# Patient Record
Sex: Female | Born: 1940 | Race: White | Hispanic: No | Marital: Married | Smoking: Never smoker
Health system: Southern US, Community
[De-identification: ages and names within clinical notes are randomized; demographics above are authoritative.]

## PROBLEM LIST (undated history)

## (undated) DIAGNOSIS — Z8719 Personal history of other diseases of the digestive system: Secondary | ICD-10-CM

## (undated) DIAGNOSIS — E039 Hypothyroidism, unspecified: Secondary | ICD-10-CM

## (undated) DIAGNOSIS — G709 Myoneural disorder, unspecified: Secondary | ICD-10-CM

## (undated) DIAGNOSIS — M1990 Unspecified osteoarthritis, unspecified site: Secondary | ICD-10-CM

## (undated) DIAGNOSIS — R42 Dizziness and giddiness: Secondary | ICD-10-CM

## (undated) DIAGNOSIS — I10 Essential (primary) hypertension: Secondary | ICD-10-CM

## (undated) DIAGNOSIS — E785 Hyperlipidemia, unspecified: Secondary | ICD-10-CM

## (undated) DIAGNOSIS — C801 Malignant (primary) neoplasm, unspecified: Secondary | ICD-10-CM

## (undated) DIAGNOSIS — F410 Panic disorder [episodic paroxysmal anxiety] without agoraphobia: Secondary | ICD-10-CM

## (undated) HISTORY — PX: JOINT REPLACEMENT: SHX530

## (undated) HISTORY — PX: COLONOSCOPY: SHX174

## (undated) HISTORY — PX: OTHER SURGICAL HISTORY: SHX169

## (undated) HISTORY — PX: ABDOMINAL HYSTERECTOMY: SHX81

---

## 2004-09-15 ENCOUNTER — Ambulatory Visit: Payer: Self-pay | Admitting: Internal Medicine

## 2004-10-06 ENCOUNTER — Ambulatory Visit: Payer: Self-pay | Admitting: Gastroenterology

## 2005-09-16 ENCOUNTER — Ambulatory Visit: Payer: Self-pay | Admitting: Internal Medicine

## 2006-09-19 ENCOUNTER — Ambulatory Visit: Payer: Self-pay | Admitting: Internal Medicine

## 2007-05-30 ENCOUNTER — Ambulatory Visit: Payer: Self-pay | Admitting: General Practice

## 2007-06-09 ENCOUNTER — Ambulatory Visit: Payer: Self-pay | Admitting: Internal Medicine

## 2007-06-14 ENCOUNTER — Inpatient Hospital Stay: Payer: Self-pay | Admitting: General Practice

## 2007-06-19 ENCOUNTER — Ambulatory Visit: Payer: Self-pay | Admitting: Internal Medicine

## 2007-07-10 ENCOUNTER — Ambulatory Visit: Payer: Self-pay | Admitting: Internal Medicine

## 2007-11-02 ENCOUNTER — Ambulatory Visit: Payer: Self-pay | Admitting: Internal Medicine

## 2007-11-08 ENCOUNTER — Ambulatory Visit: Payer: Self-pay | Admitting: Unknown Physician Specialty

## 2007-11-10 ENCOUNTER — Ambulatory Visit: Payer: Self-pay | Admitting: Internal Medicine

## 2008-05-28 ENCOUNTER — Ambulatory Visit: Payer: Self-pay | Admitting: Internal Medicine

## 2008-11-01 ENCOUNTER — Ambulatory Visit: Payer: Self-pay | Admitting: Otolaryngology

## 2009-02-08 HISTORY — PX: BREAST BIOPSY: SHX20

## 2009-08-15 ENCOUNTER — Ambulatory Visit: Payer: Self-pay | Admitting: Internal Medicine

## 2009-08-26 ENCOUNTER — Ambulatory Visit: Payer: Self-pay | Admitting: Internal Medicine

## 2009-10-01 ENCOUNTER — Ambulatory Visit: Payer: Self-pay | Admitting: Surgery

## 2010-01-30 ENCOUNTER — Ambulatory Visit: Payer: Self-pay | Admitting: Internal Medicine

## 2010-03-02 ENCOUNTER — Ambulatory Visit: Payer: Self-pay | Admitting: Surgery

## 2010-05-25 ENCOUNTER — Ambulatory Visit: Payer: Self-pay | Admitting: Gastroenterology

## 2010-08-17 ENCOUNTER — Ambulatory Visit: Payer: Self-pay | Admitting: Internal Medicine

## 2010-11-13 ENCOUNTER — Ambulatory Visit: Payer: Self-pay | Admitting: Internal Medicine

## 2011-08-18 ENCOUNTER — Ambulatory Visit: Payer: Self-pay | Admitting: Internal Medicine

## 2012-08-18 ENCOUNTER — Ambulatory Visit: Payer: Self-pay | Admitting: Internal Medicine

## 2013-10-01 ENCOUNTER — Ambulatory Visit: Payer: Self-pay | Admitting: Internal Medicine

## 2013-10-17 ENCOUNTER — Ambulatory Visit: Payer: Self-pay

## 2014-05-24 ENCOUNTER — Other Ambulatory Visit: Payer: Self-pay | Admitting: Internal Medicine

## 2014-05-24 DIAGNOSIS — Z1231 Encounter for screening mammogram for malignant neoplasm of breast: Secondary | ICD-10-CM

## 2014-10-03 ENCOUNTER — Other Ambulatory Visit: Payer: Self-pay | Admitting: Internal Medicine

## 2014-10-03 ENCOUNTER — Ambulatory Visit
Admission: RE | Admit: 2014-10-03 | Discharge: 2014-10-03 | Disposition: A | Discharge status: Home / Self Care | Payer: Medicare Other | Source: Ambulatory Visit | Attending: Internal Medicine | Admitting: Internal Medicine

## 2014-10-03 DIAGNOSIS — Z1231 Encounter for screening mammogram for malignant neoplasm of breast: Secondary | ICD-10-CM | POA: Diagnosis not present

## 2015-08-26 ENCOUNTER — Other Ambulatory Visit: Payer: Self-pay | Admitting: Internal Medicine

## 2015-08-26 DIAGNOSIS — Z1231 Encounter for screening mammogram for malignant neoplasm of breast: Secondary | ICD-10-CM

## 2015-10-06 ENCOUNTER — Ambulatory Visit: Payer: Medicare Other

## 2015-10-07 ENCOUNTER — Other Ambulatory Visit: Payer: Self-pay | Admitting: Internal Medicine

## 2015-10-09 ENCOUNTER — Ambulatory Visit: Payer: Medicare Other

## 2015-11-06 ENCOUNTER — Other Ambulatory Visit: Payer: Self-pay | Admitting: Internal Medicine

## 2015-11-06 ENCOUNTER — Ambulatory Visit
Admission: RE | Admit: 2015-11-06 | Discharge: 2015-11-06 | Disposition: A | Discharge status: Home / Self Care | Payer: Medicare Other | Source: Ambulatory Visit | Attending: Internal Medicine | Admitting: Internal Medicine

## 2015-11-06 DIAGNOSIS — R928 Other abnormal and inconclusive findings on diagnostic imaging of breast: Secondary | ICD-10-CM | POA: Diagnosis not present

## 2015-11-06 DIAGNOSIS — Z1231 Encounter for screening mammogram for malignant neoplasm of breast: Secondary | ICD-10-CM | POA: Diagnosis present

## 2015-11-10 ENCOUNTER — Other Ambulatory Visit: Payer: Self-pay | Admitting: Internal Medicine

## 2015-11-10 DIAGNOSIS — N6320 Unspecified lump in the left breast, unspecified quadrant: Secondary | ICD-10-CM

## 2015-11-18 ENCOUNTER — Ambulatory Visit
Admission: RE | Admit: 2015-11-18 | Discharge: 2015-11-18 | Disposition: A | Discharge status: Home / Self Care | Payer: Medicare Other | Source: Ambulatory Visit | Attending: Internal Medicine | Admitting: Internal Medicine

## 2015-11-18 DIAGNOSIS — N6320 Unspecified lump in the left breast, unspecified quadrant: Secondary | ICD-10-CM

## 2015-11-18 DIAGNOSIS — N6324 Unspecified lump in the left breast, lower inner quadrant: Secondary | ICD-10-CM | POA: Diagnosis not present

## 2016-11-29 ENCOUNTER — Other Ambulatory Visit: Payer: Self-pay | Admitting: Internal Medicine

## 2016-11-29 DIAGNOSIS — Z1231 Encounter for screening mammogram for malignant neoplasm of breast: Secondary | ICD-10-CM

## 2016-12-16 ENCOUNTER — Ambulatory Visit
Admission: RE | Admit: 2016-12-16 | Discharge: 2016-12-16 | Disposition: A | Discharge status: Home / Self Care | Payer: Medicare Other | Source: Ambulatory Visit | Attending: Internal Medicine | Admitting: Internal Medicine

## 2016-12-16 DIAGNOSIS — Z1231 Encounter for screening mammogram for malignant neoplasm of breast: Secondary | ICD-10-CM | POA: Diagnosis not present

## 2016-12-20 ENCOUNTER — Other Ambulatory Visit: Payer: Self-pay | Admitting: Internal Medicine

## 2016-12-20 DIAGNOSIS — R928 Other abnormal and inconclusive findings on diagnostic imaging of breast: Secondary | ICD-10-CM

## 2016-12-20 DIAGNOSIS — N6489 Other specified disorders of breast: Secondary | ICD-10-CM

## 2017-01-05 ENCOUNTER — Ambulatory Visit
Admission: RE | Admit: 2017-01-05 | Discharge: 2017-01-05 | Disposition: A | Discharge status: Home / Self Care | Payer: Medicare Other | Source: Ambulatory Visit | Attending: Internal Medicine | Admitting: Internal Medicine

## 2017-01-05 DIAGNOSIS — N6489 Other specified disorders of breast: Secondary | ICD-10-CM | POA: Diagnosis not present

## 2017-01-05 DIAGNOSIS — N6001 Solitary cyst of right breast: Secondary | ICD-10-CM | POA: Insufficient documentation

## 2017-01-05 DIAGNOSIS — R928 Other abnormal and inconclusive findings on diagnostic imaging of breast: Secondary | ICD-10-CM

## 2017-03-01 ENCOUNTER — Encounter: Payer: Self-pay | Admitting: *Deleted

## 2017-03-02 ENCOUNTER — Ambulatory Visit: Payer: Medicare Other | Admitting: Anesthesiology

## 2017-03-02 ENCOUNTER — Ambulatory Visit
Admission: RE | Admit: 2017-03-02 | Discharge: 2017-03-02 | Disposition: A | Discharge status: Home / Self Care | Payer: Medicare Other | Source: Ambulatory Visit | Attending: Internal Medicine | Admitting: Internal Medicine

## 2017-03-02 ENCOUNTER — Encounter
Admission: RE | Disposition: A | Discharge status: Home / Self Care | Payer: Self-pay | Source: Ambulatory Visit | Attending: Internal Medicine

## 2017-03-02 ENCOUNTER — Encounter: Payer: Self-pay | Admitting: *Deleted

## 2017-03-02 DIAGNOSIS — Z79890 Hormone replacement therapy (postmenopausal): Secondary | ICD-10-CM | POA: Diagnosis not present

## 2017-03-02 DIAGNOSIS — F410 Panic disorder [episodic paroxysmal anxiety] without agoraphobia: Secondary | ICD-10-CM | POA: Insufficient documentation

## 2017-03-02 DIAGNOSIS — E785 Hyperlipidemia, unspecified: Secondary | ICD-10-CM | POA: Diagnosis not present

## 2017-03-02 DIAGNOSIS — I10 Essential (primary) hypertension: Secondary | ICD-10-CM | POA: Diagnosis not present

## 2017-03-02 DIAGNOSIS — E039 Hypothyroidism, unspecified: Secondary | ICD-10-CM | POA: Diagnosis not present

## 2017-03-02 DIAGNOSIS — K635 Polyp of colon: Secondary | ICD-10-CM | POA: Diagnosis not present

## 2017-03-02 DIAGNOSIS — K621 Rectal polyp: Secondary | ICD-10-CM | POA: Insufficient documentation

## 2017-03-02 DIAGNOSIS — M1990 Unspecified osteoarthritis, unspecified site: Secondary | ICD-10-CM | POA: Diagnosis not present

## 2017-03-02 DIAGNOSIS — Z1211 Encounter for screening for malignant neoplasm of colon: Secondary | ICD-10-CM | POA: Insufficient documentation

## 2017-03-02 DIAGNOSIS — G2581 Restless legs syndrome: Secondary | ICD-10-CM | POA: Insufficient documentation

## 2017-03-02 DIAGNOSIS — Z79899 Other long term (current) drug therapy: Secondary | ICD-10-CM | POA: Diagnosis not present

## 2017-03-02 DIAGNOSIS — Z85828 Personal history of other malignant neoplasm of skin: Secondary | ICD-10-CM | POA: Diagnosis not present

## 2017-03-02 DIAGNOSIS — Z86010 Personal history of colonic polyps: Secondary | ICD-10-CM | POA: Diagnosis not present

## 2017-03-02 HISTORY — DX: Hypothyroidism, unspecified: E03.9

## 2017-03-02 HISTORY — DX: Panic disorder (episodic paroxysmal anxiety): F41.0

## 2017-03-02 HISTORY — DX: Myoneural disorder, unspecified: G70.9

## 2017-03-02 HISTORY — DX: Essential (primary) hypertension: I10

## 2017-03-02 HISTORY — PX: COLONOSCOPY WITH PROPOFOL: SHX5780

## 2017-03-02 HISTORY — DX: Personal history of other diseases of the digestive system: Z87.19

## 2017-03-02 HISTORY — DX: Unspecified osteoarthritis, unspecified site: M19.90

## 2017-03-02 HISTORY — DX: Malignant (primary) neoplasm, unspecified: C80.1

## 2017-03-02 HISTORY — DX: Hyperlipidemia, unspecified: E78.5

## 2017-03-02 SURGERY — COLONOSCOPY WITH PROPOFOL
Anesthesia: General

## 2017-03-02 MED ORDER — LIDOCAINE 2% (20 MG/ML) 5 ML SYRINGE
INTRAMUSCULAR | Status: DC | PRN
  Administered 2017-03-02: 40 mg via INTRAVENOUS

## 2017-03-02 MED ORDER — SODIUM CHLORIDE 0.9 % IV SOLN
INTRAVENOUS | Status: DC
  Administered 2017-03-02: 1000 mL via INTRAVENOUS
  Administered 2017-03-02: 11:00:00 via INTRAVENOUS

## 2017-03-02 MED ORDER — FENTANYL CITRATE (PF) 100 MCG/2ML IJ SOLN
INTRAMUSCULAR | Status: DC | PRN
  Administered 2017-03-02 (×2): 50 ug via INTRAVENOUS

## 2017-03-02 MED ORDER — FENTANYL CITRATE (PF) 100 MCG/2ML IJ SOLN
INTRAMUSCULAR | Status: AC
  Filled 2017-03-02: qty 2

## 2017-03-02 MED ORDER — PROPOFOL 10 MG/ML IV BOLUS
INTRAVENOUS | Status: DC | PRN
  Administered 2017-03-02: 100 mg via INTRAVENOUS

## 2017-03-02 MED ORDER — SODIUM CHLORIDE 0.9 % IV SOLN
INTRAVENOUS | Status: AC
  Administered 2017-03-02: 2 mg
  Filled 2017-03-02: qty 2000

## 2017-03-02 MED ORDER — PROPOFOL 500 MG/50ML IV EMUL
INTRAVENOUS | Status: AC
  Filled 2017-03-02: qty 50

## 2017-03-02 MED ORDER — EPHEDRINE SULFATE 50 MG/ML IJ SOLN
INTRAMUSCULAR | Status: DC | PRN
  Administered 2017-03-02: 5 mg via INTRAVENOUS

## 2017-03-02 MED ORDER — SODIUM CHLORIDE 0.9 % IV SOLN: 2.0000 g | Freq: Once | INTRAVENOUS | Status: DC

## 2017-03-02 MED ORDER — PHENYLEPHRINE HCL 10 MG/ML IJ SOLN
INTRAMUSCULAR | Status: DC | PRN
  Administered 2017-03-02: 100 ug via INTRAVENOUS

## 2017-03-02 MED ORDER — PROPOFOL 500 MG/50ML IV EMUL
INTRAVENOUS | Status: DC | PRN
  Administered 2017-03-02: 140 ug/kg/min via INTRAVENOUS

## 2017-03-02 NOTE — Anesthesia Post-op Follow-up Note (Signed)
Anesthesia QCDR form completed.        

## 2017-03-02 NOTE — Anesthesia Preprocedure Evaluation (Signed)
Anesthesia Evaluation  Patient identified by MRN, date of birth, ID band Patient awake    Reviewed: Allergy & Precautions, NPO status , Patient's Chart, lab work & pertinent test results  History of Anesthesia Complications Negative for: history of anesthetic complications  Airway Mallampati: III  TM Distance: >3 FB Neck ROM: Full    Dental no notable dental hx.    Pulmonary neg pulmonary ROS, neg sleep apnea, neg COPD,    breath sounds clear to auscultation- rhonchi (-) wheezing      Cardiovascular hypertension, Pt. on medications (-) CAD, (-) Past MI, (-) Cardiac Stents and (-) CABG  Rhythm:Regular Rate:Normal - Systolic murmurs and - Diastolic murmurs    Neuro/Psych PSYCHIATRIC DISORDERS Anxiety negative neurological ROS     GI/Hepatic Neg liver ROS, hiatal hernia,   Endo/Other  neg diabetesHypothyroidism   Renal/GU negative Renal ROS     Musculoskeletal  (+) Arthritis ,   Abdominal (+) + obese,   Peds  Hematology negative hematology ROS (+)   Anesthesia Other Findings Past Medical History: No date: Arthritis No date: Cancer ([place])     Comment:  skin cancer No date: History of hiatal hernia No date: Hyperlipidemia No date: Hypertension No date: Hypothyroidism No date: Neuromuscular disorder (HCC)     Comment:  restless leg No date: Panic disorder   Reproductive/Obstetrics                             Anesthesia Physical Anesthesia Plan  ASA: II  Anesthesia Plan: General   Post-op Pain Management:    Induction: Intravenous  PONV Risk Score and Plan: 2 and Propofol infusion  Airway Management Planned: Natural Airway  Additional Equipment:   Intra-op Plan:   Post-operative Plan:   Informed Consent: I have reviewed the patients History and Physical, chart, labs and discussed the procedure including the risks, benefits and alternatives for the proposed anesthesia with  the patient or authorized representative who has indicated his/her understanding and acceptance.   Dental advisory given  Plan Discussed with: CRNA and Anesthesiologist  Anesthesia Plan Comments:         Anesthesia Quick Evaluation

## 2017-03-02 NOTE — Interval H&P Note (Signed)
History and Physical Interval Note:  03/02/2017 10:39 AM  Michelle Middleton  has presented today for surgery, with the diagnosis of ph ta polyps  The various methods of treatment have been discussed with the patient and family. After consideration of risks, benefits and other options for treatment, the patient has consented to  Procedure(s): COLONOSCOPY WITH PROPOFOL (N/A) as a surgical intervention .  The patient's history has been reviewed, patient examined, no change in status, stable for surgery.  I have reviewed the patient's chart and labs.  Questions were answered to the patient's satisfaction.     [place], [place]

## 2017-03-02 NOTE — H&P (Signed)
Outpatient short stay form Pre-procedure 03/02/2017 10:38 AM Pericles Carmicheal K. Alice Reichert, M.D.  Primary Physician: Emily Filbert, M.D.  Reason for visit:   Personal hx of adenomatous colon polyps - 2006  History of present illness:  Patient presents for a personal hx of colon polyps and has colonoscopy scheduled today for colon polyps surveillance. Patient denies abdominal pain, change in bowel habits or rectal bleeding.     Current Facility-Administered Medications:  .  0.9 %  sodium chloride infusion, , Intravenous, Continuous, [place], Benay Pike, MD, Last Rate: 20 mL/hr at 03/02/17 1004 .  ampicillin (OMNIPEN) 2 g in sodium chloride 0.9 % 50 mL IVPB, 2 g, Intravenous, Once, [place], Benay Pike, MD  Medications Prior to Admission  Medication Sig Dispense Refill Last Dose  . ALPRAZolam (XANAX) 0.5 MG tablet Take 0.5 mg by mouth 2 (two) times daily as needed for anxiety.     Marland Kitchen amLODipine (NORVASC) 5 MG tablet Take 5 mg by mouth daily.   03/02/2017 at Unknown time  . cetirizine (ZYRTEC) 10 MG tablet Take 10 mg by mouth daily.     Marland Kitchen docusate sodium (COLACE) 100 MG capsule Take 100 mg by mouth daily.     . fluticasone (FLONASE) 50 MCG/ACT nasal spray Place 2 sprays into both nostrils daily.     Marland Kitchen gabapentin (NEURONTIN) 100 MG capsule Take 100 mg by mouth at bedtime.     . hydrochlorothiazide (HYDRODIURIL) 25 MG tablet Take 25 mg by mouth daily as needed.     Marland Kitchen levothyroxine (SYNTHROID, LEVOTHROID) 112 MCG tablet Take 112 mcg by mouth daily before breakfast.     . meloxicam (MOBIC) 7.5 MG tablet Take 7.5 mg by mouth daily.     . pantoprazole (PROTONIX) 40 MG tablet Take 40 mg by mouth daily.     Marland Kitchen spironolactone (ALDACTONE) 25 MG tablet Take 25 mg by mouth daily.     . [DISCONTINUED] telmisartan (MICARDIS) 80 MG tablet Take 80 mg by mouth daily.        Allergies  Allergen Reactions  . Allegra-D [Fexofenadine-Pseudoephed Er]   . Aspirin   . Coumadin [Warfarin]   . Heparin   . Lovenox [Enoxaparin]    . Paxil [Paroxetine Hcl]      Past Medical History:  Diagnosis Date  . Arthritis   . Cancer ([place])    skin cancer  . History of hiatal hernia   . Hyperlipidemia   . Hypertension   . Hypothyroidism   . Neuromuscular disorder (HCC)    restless leg  . Panic disorder     Review of systems:      Physical Exam  General appearance: alert, cooperative and appears stated age Resp: clear to auscultation bilaterally Cardio: regular rate and rhythm, S1, S2 normal, no murmur, click, rub or gallop GI: soft, non-tender; bowel sounds normal; no masses,  no organomegaly Extremities: extremities normal, atraumatic, no cyanosis or edema     Planned procedures: Colonoscopy. The patient understands the nature of the planned procedure, indications, risks, alternatives and potential complications including but not limited to bleeding, infection, perforation, damage to internal organs and possible oversedation/side effects from anesthesia. The patient agrees and gives consent to proceed.  Please refer to procedure notes for findings, recommendations and patient disposition/instructions.    Daiki Dicostanzo K. Alice Reichert, M.D. Gastroenterology 03/02/2017  10:38 AM

## 2017-03-02 NOTE — Op Note (Signed)
[facility] Patient Name: Michelle Middleton Procedure Date: 03/02/2017 10:41 AM MRN: [id number] Account #: [id number] Date of Birth: [birth year] Admit Type: Outpatient Age: 77 Room: [facility] ENDO ROOM 3 Gender: Female Note Status: Finalized Procedure:            Colonoscopy Indications:          High risk colon cancer surveillance: Personal history                        of colonic polyps Providers:            Benay Pike. Alice Reichert MD, MD Referring MD:         Rusty Aus, MD (Referring MD) Medicines:            Propofol per Anesthesia Complications:        No immediate complications. Procedure:            Pre-Anesthesia Assessment:                       - The risks and benefits of the procedure and the                        sedation options and risks were discussed with the                        patient. All questions were answered and informed                        consent was obtained.                       - Patient identification and proposed procedure were                        verified prior to the procedure by the nurse. The                        procedure was verified in the procedure room.                       - ASA Grade Assessment: II - A patient with mild                        systemic disease.                       - After reviewing the risks and benefits, the patient                        was deemed in satisfactory condition to undergo the                        procedure.                       After obtaining informed consent, the colonoscope was                        passed under direct vision. Throughout the procedure,  the patient's blood pressure, pulse, and oxygen                        saturations were monitored continuously. The                        Colonoscope was introduced through the anus and                        advanced to the the cecum, identified by appendiceal   orifice and ileocecal valve. The colonoscopy was                        somewhat difficult due to significant looping.                        Successful completion of the procedure was aided by                        applying abdominal pressure. The patient tolerated the                        procedure well. The quality of the bowel preparation                        was good. Findings:      The perianal and digital rectal examinations were normal. Pertinent       negatives include normal sphincter tone and no palpable rectal lesions.      Two sessile polyps were found in the rectum and sigmoid colon. The       polyps were 2 to 3 mm in size. These polyps were removed with a jumbo       cold forceps. Resection and retrieval were complete.      The exam was otherwise without abnormality.      Non-bleeding internal hemorrhoids were found during retroflexion. The       hemorrhoids were Grade I (internal hemorrhoids that do not prolapse). Impression:           - Two 2 to 3 mm polyps in the rectum and in the sigmoid                        colon, removed with a jumbo cold forceps. Resected and                        retrieved.                       - The examination was otherwise normal. Recommendation:       - Patient has a contact number available for                        emergencies. The signs and symptoms of potential                        delayed complications were discussed with the patient.                        Return to normal activities tomorrow. Written discharge  instructions were provided to the patient.                       - Resume previous diet.                       - Continue present medications.                       - Await pathology results.                       - Repeat colonoscopy is recommended for surveillance.                        The colonoscopy date will be determined after pathology                        results from today's exam  become available for review.                       - Return to GI office PRN.                       - The findings and recommendations were discussed with                        the patient and their family. Procedure Code(s):    --- Professional ---                       [phone], Colonoscopy, flexible; with biopsy, single or                        multiple Diagnosis Code(s):    --- Professional ---                       D12.5, Benign neoplasm of sigmoid colon                       K62.1, Rectal polyp                       Z86.010, Personal history of colonic polyps CPT copyright 2016 [employer]. All rights reserved. The codes documented in this report are preliminary and upon coder review may  be revised to meet current compliance requirements. Efrain Sella MD, MD 03/02/2017 11:14:23 AM This report has been signed electronically. Number of Addenda: 0 Note Initiated On: 03/02/2017 10:41 AM Scope Withdrawal Time: 0 hours 7 minutes 10 seconds  Total Procedure Duration: 0 hours 14 minutes 35 seconds       [facility]

## 2017-03-02 NOTE — Transfer of Care (Signed)
Immediate Anesthesia Transfer of Care Note  Patient: Michelle Middleton  Procedure(s) Performed: COLONOSCOPY WITH PROPOFOL (N/A )  Patient Location: PACU and Endoscopy Unit  Anesthesia Type:General  Level of Consciousness: awake, oriented and patient cooperative  Airway & Oxygen Therapy: Patient Spontanous Breathing and Patient connected to nasal cannula oxygen  Post-op Assessment: Report given to RN and Post -op Vital signs reviewed and stable  Post vital signs: Reviewed and stable  Last Vitals:  Vitals:   03/02/17 0942 03/02/17 1110  BP: (!) 158/76   Pulse: (!) 105   Resp: 18   Temp: (!) 35.8 C (!) 36.4 C  SpO2: 98%     Last Pain:  Vitals:   03/02/17 1110  TempSrc: Tympanic         Complications: No apparent anesthesia complications

## 2017-03-02 NOTE — Anesthesia Postprocedure Evaluation (Signed)
Anesthesia Post Note  Patient: Michelle Middleton  Procedure(s) Performed: COLONOSCOPY WITH PROPOFOL (N/A )  Patient location during evaluation: Endoscopy Anesthesia Type: General Level of consciousness: awake and alert and oriented Pain management: pain level controlled Vital Signs Assessment: post-procedure vital signs reviewed and stable Respiratory status: spontaneous breathing, nonlabored ventilation and respiratory function stable Cardiovascular status: blood pressure returned to baseline and stable Postop Assessment: no signs of nausea or vomiting Anesthetic complications: no     Last Vitals:  Vitals:   03/02/17 1120 03/02/17 1123  BP:    Pulse: 87 89  Resp: 18 18  Temp:    SpO2: 97% 96%    Last Pain:  Vitals:   03/02/17 1115  TempSrc: Tympanic                 Brandey Vandalen

## 2017-03-03 ENCOUNTER — Encounter: Payer: Self-pay | Admitting: Internal Medicine

## 2017-03-03 LAB — SURGICAL PATHOLOGY

## 2018-03-20 ENCOUNTER — Other Ambulatory Visit: Payer: Self-pay | Admitting: Internal Medicine

## 2018-03-20 DIAGNOSIS — Z1231 Encounter for screening mammogram for malignant neoplasm of breast: Secondary | ICD-10-CM

## 2018-04-03 ENCOUNTER — Ambulatory Visit
Admission: RE | Admit: 2018-04-03 | Discharge: 2018-04-03 | Disposition: A | Discharge status: Home / Self Care | Payer: Medicare Other | Source: Ambulatory Visit | Attending: Internal Medicine | Admitting: Internal Medicine

## 2018-04-03 ENCOUNTER — Encounter (INDEPENDENT_AMBULATORY_CARE_PROVIDER_SITE_OTHER): Payer: Self-pay

## 2018-04-03 DIAGNOSIS — Z1231 Encounter for screening mammogram for malignant neoplasm of breast: Secondary | ICD-10-CM | POA: Diagnosis present

## 2018-11-16 ENCOUNTER — Other Ambulatory Visit: Payer: Self-pay

## 2018-11-16 DIAGNOSIS — Z20822 Contact with and (suspected) exposure to covid-19: Secondary | ICD-10-CM

## 2018-11-17 LAB — NOVEL CORONAVIRUS, NAA: SARS-CoV-2, NAA: NOT DETECTED

## 2019-05-22 ENCOUNTER — Encounter: Payer: Self-pay | Admitting: Ophthalmology

## 2019-05-22 ENCOUNTER — Other Ambulatory Visit: Payer: Self-pay

## 2019-05-24 ENCOUNTER — Other Ambulatory Visit: Payer: Self-pay

## 2019-05-24 ENCOUNTER — Other Ambulatory Visit
Admission: RE | Admit: 2019-05-24 | Discharge: 2019-05-24 | Disposition: A | Discharge status: Home / Self Care | Payer: Medicare Other | Source: Ambulatory Visit | Attending: Ophthalmology | Admitting: Ophthalmology

## 2019-05-24 DIAGNOSIS — Z01812 Encounter for preprocedural laboratory examination: Secondary | ICD-10-CM | POA: Diagnosis present

## 2019-05-24 DIAGNOSIS — Z20822 Contact with and (suspected) exposure to covid-19: Secondary | ICD-10-CM | POA: Insufficient documentation

## 2019-05-24 LAB — SARS CORONAVIRUS 2 (TAT 6-24 HRS): SARS Coronavirus 2: NEGATIVE

## 2019-05-24 IMAGING — MG MM DIGITAL SCREENING BILAT W/ TOMO W/ CAD
8 of 16 series · 8 of 32 positions shown · non-contrast
Comparison: Previous exam(s).

CLINICAL DATA: Screening.

EXAM:
2D DIGITAL SCREENING BILATERAL MAMMOGRAM WITH CAD AND ADJUNCT TOMO

[R MLO (1 of 2)]
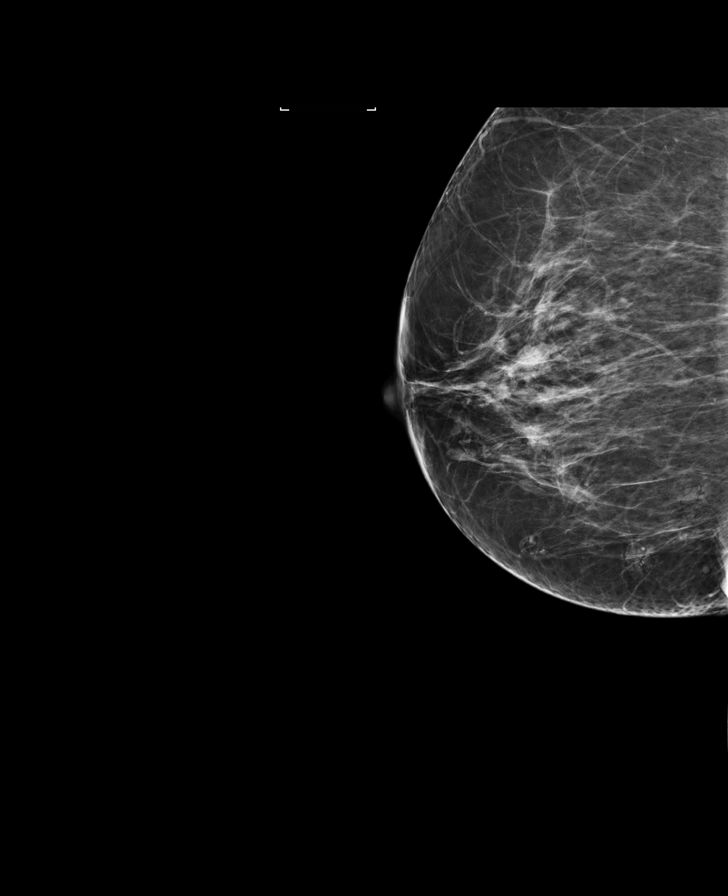

[R MLO (2 of 2)]
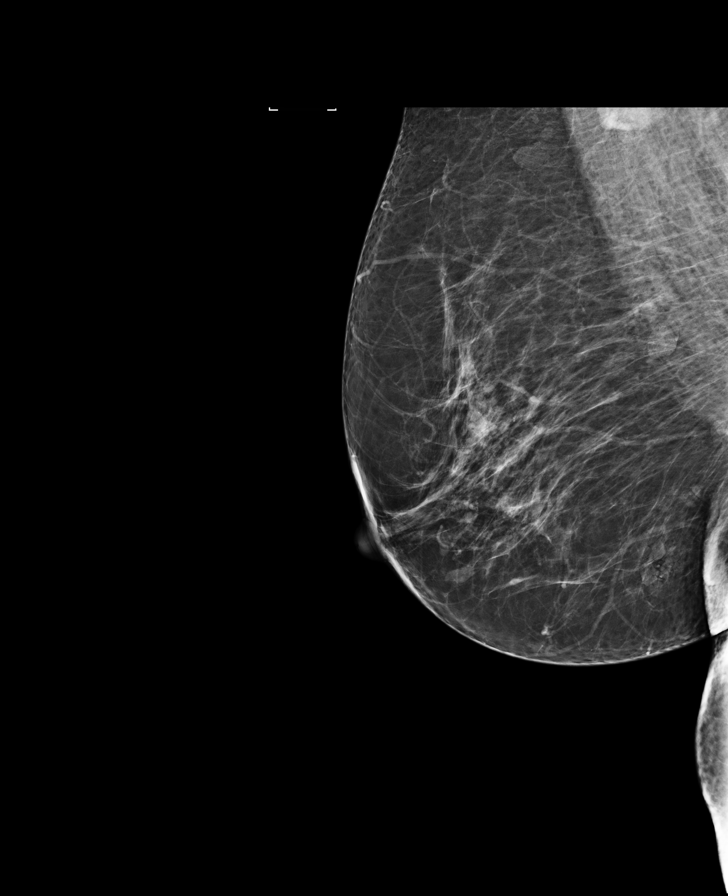

[R MLO synth-2D]
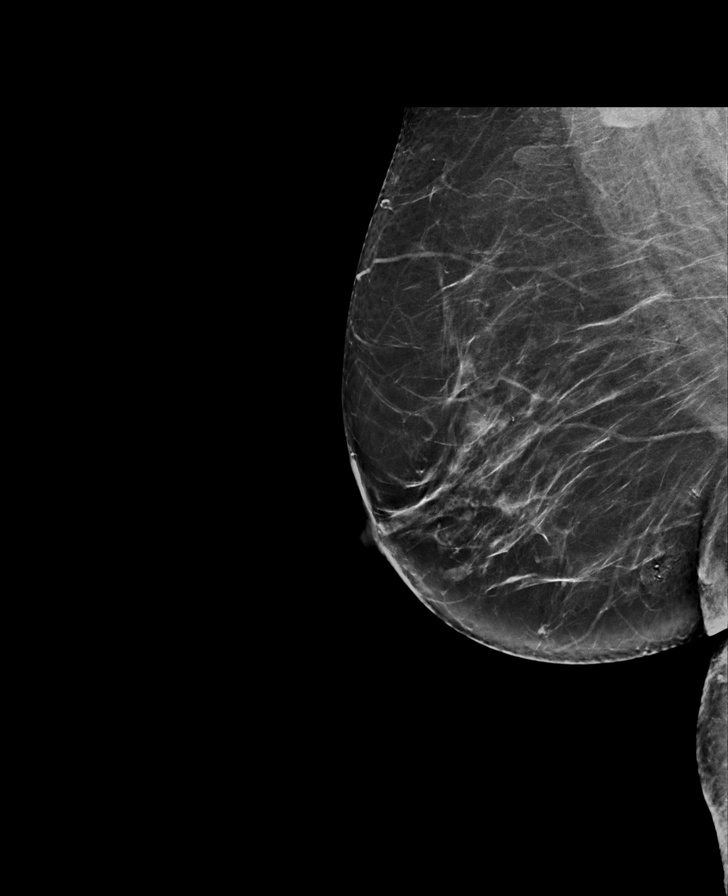

[L MLO synth-2D]
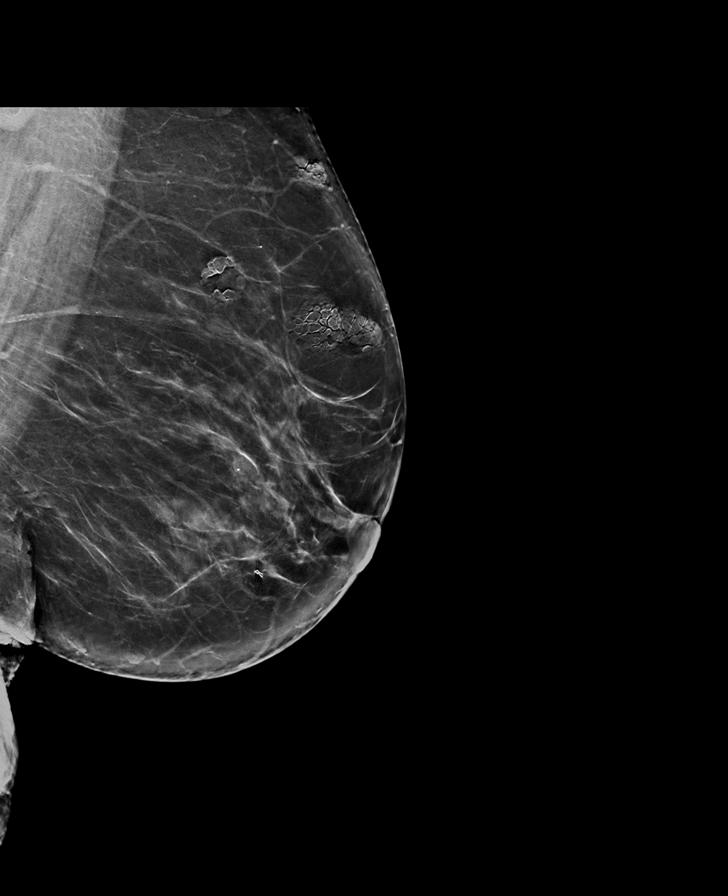

[L MLO]
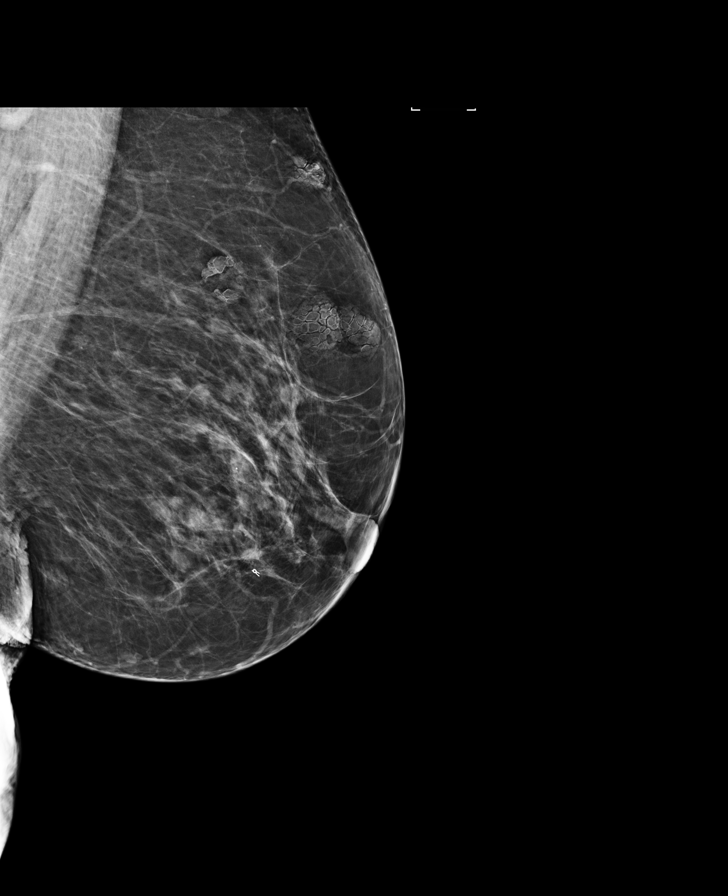

[R CC]
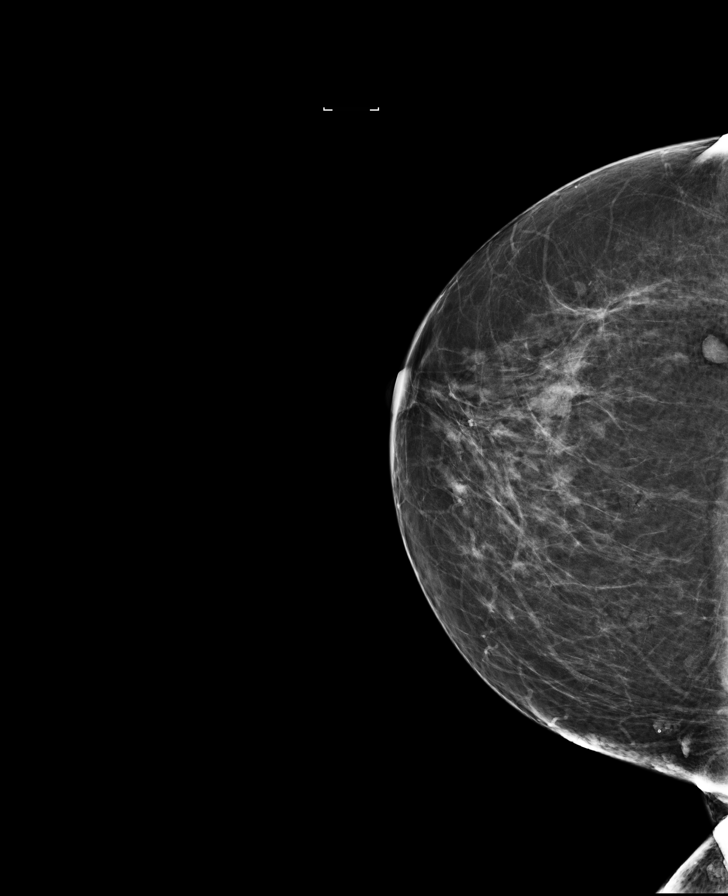

[L CC]
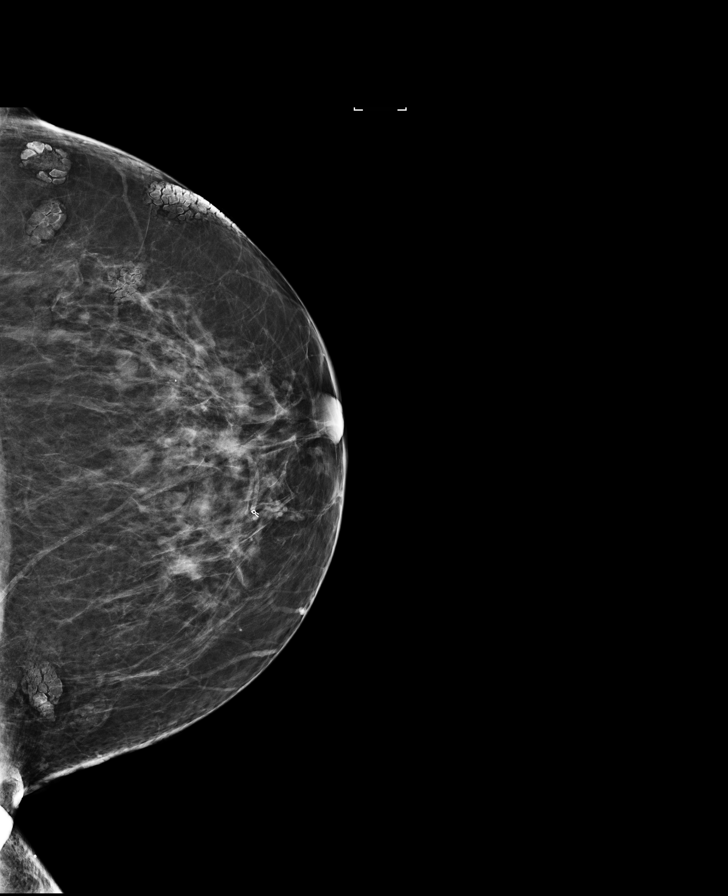

[R CC synth-2D]
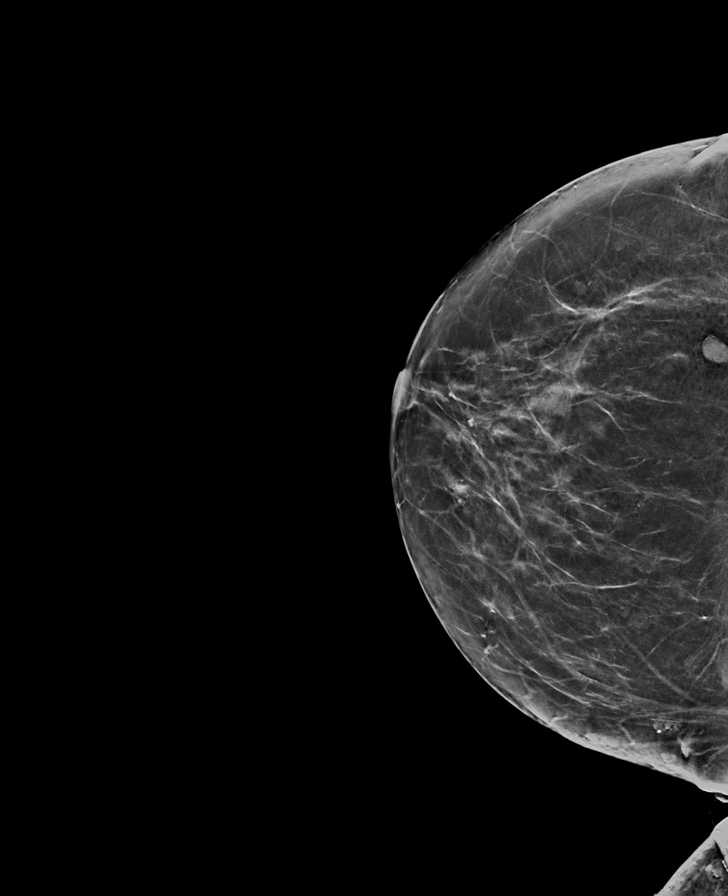

[8 of 32 positions shown; findings below may reference images not displayed]

ACR Breast Density Category b: There are scattered areas of
fibroglandular density.
FINDINGS: In the right breast, a possible asymmetry warrants further
evaluation. In the left breast, no findings suspicious for
malignancy. Images were processed with CAD.
IMPRESSION: Further evaluation is suggested for possible asymmetry in the right
breast.

RECOMMENDATION:
Diagnostic mammogram and possibly ultrasound of the right breast.
(Code:[id number]-[id number])

The patient will be contacted regarding the findings, and additional
imaging will be scheduled.

BI-RADS CATEGORY  0: Incomplete. Need additional imaging evaluation
and/or prior mammograms for comparison.

## 2019-05-24 NOTE — Discharge Instructions (Signed)

## 2019-05-28 ENCOUNTER — Ambulatory Visit
Admission: RE | Admit: 2019-05-28 | Discharge: 2019-05-28 | Disposition: A | Discharge status: Home / Self Care | Payer: Medicare Other | Attending: Ophthalmology | Admitting: Ophthalmology

## 2019-05-28 ENCOUNTER — Encounter
Admission: RE | Disposition: A | Discharge status: Home / Self Care | Payer: Self-pay | Source: Home / Self Care | Attending: Ophthalmology

## 2019-05-28 ENCOUNTER — Ambulatory Visit: Payer: Medicare Other | Admitting: Anesthesiology

## 2019-05-28 ENCOUNTER — Encounter: Payer: Self-pay | Admitting: Ophthalmology

## 2019-05-28 DIAGNOSIS — H2512 Age-related nuclear cataract, left eye: Secondary | ICD-10-CM | POA: Insufficient documentation

## 2019-05-28 DIAGNOSIS — M1990 Unspecified osteoarthritis, unspecified site: Secondary | ICD-10-CM | POA: Insufficient documentation

## 2019-05-28 DIAGNOSIS — K219 Gastro-esophageal reflux disease without esophagitis: Secondary | ICD-10-CM | POA: Insufficient documentation

## 2019-05-28 DIAGNOSIS — Z79899 Other long term (current) drug therapy: Secondary | ICD-10-CM | POA: Diagnosis not present

## 2019-05-28 DIAGNOSIS — Z6841 Body Mass Index (BMI) 40.0 and over, adult: Secondary | ICD-10-CM | POA: Diagnosis not present

## 2019-05-28 DIAGNOSIS — E785 Hyperlipidemia, unspecified: Secondary | ICD-10-CM | POA: Diagnosis not present

## 2019-05-28 DIAGNOSIS — E039 Hypothyroidism, unspecified: Secondary | ICD-10-CM | POA: Diagnosis not present

## 2019-05-28 DIAGNOSIS — I10 Essential (primary) hypertension: Secondary | ICD-10-CM | POA: Insufficient documentation

## 2019-05-28 DIAGNOSIS — Z79890 Hormone replacement therapy (postmenopausal): Secondary | ICD-10-CM | POA: Diagnosis not present

## 2019-05-28 HISTORY — PX: CATARACT EXTRACTION W/PHACO: SHX586

## 2019-05-28 HISTORY — DX: Dizziness and giddiness: R42

## 2019-05-28 SURGERY — PHACOEMULSIFICATION, CATARACT, WITH IOL INSERTION
Anesthesia: Monitor Anesthesia Care | Site: Eye | Laterality: Left

## 2019-05-28 MED ORDER — ACETAMINOPHEN 325 MG PO TABS: 325.0000 mg | ORAL_TABLET | ORAL | Status: DC | PRN

## 2019-05-28 MED ORDER — ONDANSETRON HCL 4 MG/2ML IJ SOLN: 4.0000 mg | Freq: Once | INTRAMUSCULAR | Status: DC | PRN

## 2019-05-28 MED ORDER — SODIUM HYALURONATE 23 MG/ML IO SOLN
INTRAOCULAR | Status: DC | PRN
  Administered 2019-05-28: 0.6 mL via INTRAOCULAR

## 2019-05-28 MED ORDER — LIDOCAINE HCL (PF) 2 % IJ SOLN
INTRAOCULAR | Status: DC | PRN
  Administered 2019-05-28: 08:00:00 1 mL via INTRAOCULAR

## 2019-05-28 MED ORDER — TETRACAINE HCL 0.5 % OP SOLN
1.0000 [drp] | OPHTHALMIC | Status: DC | PRN
  Administered 2019-05-28 (×3): 1 [drp] via OPHTHALMIC

## 2019-05-28 MED ORDER — SODIUM HYALURONATE 10 MG/ML IO SOLN
INTRAOCULAR | Status: DC | PRN
  Administered 2019-05-28: 0.55 mL via INTRAOCULAR

## 2019-05-28 MED ORDER — ARMC OPHTHALMIC DILATING DROPS
1.0000 "application " | OPHTHALMIC | Status: DC | PRN
  Administered 2019-05-28 (×3): 1 via OPHTHALMIC

## 2019-05-28 MED ORDER — MOXIFLOXACIN HCL 0.5 % OP SOLN
OPHTHALMIC | Status: DC | PRN
  Administered 2019-05-28: 0.2 mL via OPHTHALMIC

## 2019-05-28 MED ORDER — MIDAZOLAM HCL 2 MG/2ML IJ SOLN
INTRAMUSCULAR | Status: DC | PRN
  Administered 2019-05-28: 2 mg via INTRAVENOUS

## 2019-05-28 MED ORDER — ACETAMINOPHEN 160 MG/5ML PO SOLN: 325.0000 mg | ORAL | Status: DC | PRN

## 2019-05-28 MED ORDER — EPINEPHRINE PF 1 MG/ML IJ SOLN
INTRAOCULAR | Status: DC | PRN
  Administered 2019-05-28: 08:00:00 82 mL via OPHTHALMIC

## 2019-05-28 MED ORDER — FENTANYL CITRATE (PF) 100 MCG/2ML IJ SOLN
INTRAMUSCULAR | Status: DC | PRN
  Administered 2019-05-28 (×2): 50 ug via INTRAVENOUS

## 2019-05-28 SURGICAL SUPPLY — 20 items
CANNULA ANT/CHMB 27G (MISCELLANEOUS) ×2 IMPLANT
CANNULA ANT/CHMB 27GA (MISCELLANEOUS) ×6 IMPLANT
DISSECTOR HYDRO NUCLEUS 50X22 (MISCELLANEOUS) ×3 IMPLANT
GLOVE SURG LX 7.5 STRW (GLOVE) ×2
GLOVE SURG LX STRL 7.5 STRW (GLOVE) ×1 IMPLANT
GLOVE SURG SYN 8.5  E (GLOVE) ×2
GLOVE SURG SYN 8.5 E (GLOVE) ×1 IMPLANT
GLOVE SURG SYN 8.5 PF PI (GLOVE) ×1 IMPLANT
GOWN STRL REUS W/ TWL LRG LVL3 (GOWN DISPOSABLE) ×2 IMPLANT
GOWN STRL REUS W/TWL LRG LVL3 (GOWN DISPOSABLE) ×4
LENS IOL DIOP 22.5 (Intraocular Lens) ×3 IMPLANT
LENS IOL TECNIS MONO 22.5 (Intraocular Lens) IMPLANT
MARKER SKIN DUAL TIP RULER LAB (MISCELLANEOUS) ×3 IMPLANT
PACK DR. KING ARMS (PACKS) ×3 IMPLANT
PACK EYE AFTER SURG (MISCELLANEOUS) ×3 IMPLANT
PACK OPTHALMIC (MISCELLANEOUS) ×3 IMPLANT
SYR 3ML LL SCALE MARK (SYRINGE) ×3 IMPLANT
SYR TB 1ML LUER SLIP (SYRINGE) ×3 IMPLANT
WATER STERILE IRR 250ML POUR (IV SOLUTION) ×3 IMPLANT
WIPE NON LINTING 3.25X3.25 (MISCELLANEOUS) ×3 IMPLANT

## 2019-05-28 NOTE — Anesthesia Preprocedure Evaluation (Signed)
Anesthesia Evaluation  Patient identified by MRN, date of birth, ID band Patient awake    History of Anesthesia Complications Negative for: history of anesthetic complications  Airway Mallampati: II  TM Distance: >3 FB Neck ROM: Full    Dental no notable dental hx.    Pulmonary neg pulmonary ROS,    Pulmonary exam normal        Cardiovascular hypertension, Normal cardiovascular exam     Neuro/Psych    GI/Hepatic Neg liver ROS, GERD  Medicated and Controlled,  Endo/Other  Hypothyroidism Morbid obesity (BMI 40)  Renal/GU negative Renal ROS     Musculoskeletal  (+) Arthritis ,   Abdominal   Peds  Hematology negative hematology ROS (+)   Anesthesia Other Findings   Reproductive/Obstetrics                             Anesthesia Physical Anesthesia Plan  ASA: III  Anesthesia Plan: MAC   Post-op Pain Management:    Induction: Intravenous  PONV Risk Score and Plan: 2 and Midazolam and Treatment may vary due to age or medical condition  Airway Management Planned: Nasal Cannula and Natural Airway  Additional Equipment: None  Intra-op Plan:   Post-operative Plan:   Informed Consent: I have reviewed the patients History and Physical, chart, labs and discussed the procedure including the risks, benefits and alternatives for the proposed anesthesia with the patient or authorized representative who has indicated his/her understanding and acceptance.       Plan Discussed with: CRNA  Anesthesia Plan Comments:         Anesthesia Quick Evaluation

## 2019-05-28 NOTE — Anesthesia Procedure Notes (Signed)
Procedure Name: MAC Date/Time: 05/28/2019 8:01 AM Performed by: Jeannene Patella, CRNA Pre-anesthesia Checklist: Patient identified, Emergency Drugs available, Suction available, Patient being monitored and Timeout performed Patient Re-evaluated:Patient Re-evaluated prior to induction Oxygen Delivery Method: Nasal cannula

## 2019-05-28 NOTE — Op Note (Signed)
OPERATIVE NOTE  JILLION CARRIE [id number] 05/28/2019   PREOPERATIVE DIAGNOSIS:  Nuclear sclerotic cataract left eye.  H25.12   POSTOPERATIVE DIAGNOSIS:    Nuclear sclerotic cataract left eye.     PROCEDURE:  Phacoemusification with posterior chamber intraocular lens placement of the left eye   LENS:   Implant Name Type Inv. Item Serial No. Manufacturer Lot No. LRB No. Used Action  LENS IOL DIOP 22.5 - [id number] Intraocular Lens LENS IOL DIOP 22.5 [id number]  Left 1 Implanted      Procedure(s) with comments: CATARACT EXTRACTION PHACO AND INTRAOCULAR LENS PLACEMENT (IOC) LEFT (Left) - 2.89 0:25.9  DCB0 +22.5   ULTRASOUND TIME: 0 minutes 25 seconds.  CDE 2.89   SURGEON:  Benay Pillow, MD, MPH   ANESTHESIA:  Topical with tetracaine drops augmented with 1% preservative-free intracameral lidocaine.  ESTIMATED BLOOD LOSS: <1 mL   COMPLICATIONS:  None.   DESCRIPTION OF PROCEDURE:  The patient was identified in the holding room and transported to the operating room and placed in the supine position under the operating microscope.  The left eye was identified as the operative eye and it was prepped and draped in the usual sterile ophthalmic fashion.   A 1.0 millimeter clear-corneal paracentesis was made at the 5:00 position. 0.5 ml of preservative-free 1% lidocaine with epinephrine was injected into the anterior chamber.  The anterior chamber was filled with Healon 5 viscoelastic.  A 2.4 millimeter keratome was used to make a near-clear corneal incision at the 2:00 position.  A curvilinear capsulorrhexis was made with a cystotome and capsulorrhexis forceps.  Balanced salt solution was used to hydrodissect and hydrodelineate the nucleus.   Phacoemulsification was then used in stop and chop fashion to remove the lens nucleus and epinucleus.  The remaining cortex was then removed using the irrigation and aspiration handpiece. Healon was then placed into the capsular bag to distend it  for lens placement.  A lens was then injected into the capsular bag.  The remaining viscoelastic was aspirated.   Wounds were hydrated with balanced salt solution.  The anterior chamber was inflated to a physiologic pressure with balanced salt solution.  Intracameral vigamox 0.1 mL undiltued was injected into the eye and a drop placed onto the ocular surface.  No wound leaks were noted.  The patient was taken to the recovery room in stable condition without complications of anesthesia or surgery  Benay Pillow 05/28/2019, 8:18 AM

## 2019-05-28 NOTE — Transfer of Care (Signed)
Immediate Anesthesia Transfer of Care Note  Patient: Michelle Middleton  Procedure(s) Performed: CATARACT EXTRACTION PHACO AND INTRAOCULAR LENS PLACEMENT (IOC) LEFT (Left Eye)  Patient Location: PACU  Anesthesia Type: MAC  Level of Consciousness: awake, alert  and patient cooperative  Airway and Oxygen Therapy: Patient Spontanous Breathing and Patient connected to supplemental oxygen  Post-op Assessment: Post-op Vital signs reviewed, Patient's Cardiovascular Status Stable, Respiratory Function Stable, Patent Airway and No signs of Nausea or vomiting  Post-op Vital Signs: Reviewed and stable  Complications: No apparent anesthesia complications

## 2019-05-28 NOTE — H&P (Signed)

## 2019-05-28 NOTE — Anesthesia Postprocedure Evaluation (Signed)
Anesthesia Post Note  Patient: Michelle Middleton  Procedure(s) Performed: CATARACT EXTRACTION PHACO AND INTRAOCULAR LENS PLACEMENT (IOC) LEFT (Left Eye)     Patient location during evaluation: PACU Anesthesia Type: MAC Level of consciousness: awake and alert Pain management: pain level controlled Vital Signs Assessment: post-procedure vital signs reviewed and stable Respiratory status: spontaneous breathing Cardiovascular status: blood pressure returned to baseline Postop Assessment: no apparent nausea or vomiting, adequate PO intake and no headache Anesthetic complications: no    Adele Barthel Alyah Boehning

## 2019-05-29 ENCOUNTER — Encounter: Payer: Self-pay | Admitting: *Deleted

## 2019-07-12 ENCOUNTER — Encounter: Payer: Self-pay | Admitting: Ophthalmology

## 2019-07-12 ENCOUNTER — Other Ambulatory Visit: Payer: Self-pay

## 2019-07-19 ENCOUNTER — Other Ambulatory Visit: Payer: Self-pay

## 2019-07-19 ENCOUNTER — Other Ambulatory Visit
Admission: RE | Admit: 2019-07-19 | Discharge: 2019-07-19 | Disposition: A | Discharge status: Home / Self Care | Payer: Medicare Other | Source: Ambulatory Visit | Attending: Ophthalmology | Admitting: Ophthalmology

## 2019-07-19 DIAGNOSIS — Z20822 Contact with and (suspected) exposure to covid-19: Secondary | ICD-10-CM | POA: Insufficient documentation

## 2019-07-19 DIAGNOSIS — Z01812 Encounter for preprocedural laboratory examination: Secondary | ICD-10-CM | POA: Diagnosis present

## 2019-07-20 LAB — SARS CORONAVIRUS 2 (TAT 6-24 HRS): SARS Coronavirus 2: NEGATIVE

## 2019-07-20 NOTE — Discharge Instructions (Signed)

## 2019-07-23 ENCOUNTER — Encounter: Payer: Self-pay | Admitting: Ophthalmology

## 2019-07-23 ENCOUNTER — Ambulatory Visit
Admission: RE | Admit: 2019-07-23 | Discharge: 2019-07-23 | Disposition: A | Discharge status: Home / Self Care | Payer: Medicare Other | Attending: Ophthalmology | Admitting: Ophthalmology

## 2019-07-23 ENCOUNTER — Encounter
Admission: RE | Disposition: A | Discharge status: Home / Self Care | Payer: Self-pay | Source: Home / Self Care | Attending: Ophthalmology

## 2019-07-23 ENCOUNTER — Ambulatory Visit: Payer: Medicare Other | Admitting: Anesthesiology

## 2019-07-23 ENCOUNTER — Other Ambulatory Visit: Payer: Self-pay

## 2019-07-23 DIAGNOSIS — Z79890 Hormone replacement therapy (postmenopausal): Secondary | ICD-10-CM | POA: Insufficient documentation

## 2019-07-23 DIAGNOSIS — Z79899 Other long term (current) drug therapy: Secondary | ICD-10-CM | POA: Diagnosis not present

## 2019-07-23 DIAGNOSIS — K219 Gastro-esophageal reflux disease without esophagitis: Secondary | ICD-10-CM | POA: Insufficient documentation

## 2019-07-23 DIAGNOSIS — I10 Essential (primary) hypertension: Secondary | ICD-10-CM | POA: Diagnosis not present

## 2019-07-23 DIAGNOSIS — H2511 Age-related nuclear cataract, right eye: Secondary | ICD-10-CM | POA: Insufficient documentation

## 2019-07-23 DIAGNOSIS — M1990 Unspecified osteoarthritis, unspecified site: Secondary | ICD-10-CM | POA: Diagnosis not present

## 2019-07-23 DIAGNOSIS — E039 Hypothyroidism, unspecified: Secondary | ICD-10-CM | POA: Diagnosis not present

## 2019-07-23 DIAGNOSIS — F419 Anxiety disorder, unspecified: Secondary | ICD-10-CM | POA: Diagnosis not present

## 2019-07-23 DIAGNOSIS — Z6841 Body Mass Index (BMI) 40.0 and over, adult: Secondary | ICD-10-CM | POA: Diagnosis not present

## 2019-07-23 HISTORY — PX: CATARACT EXTRACTION W/PHACO: SHX586

## 2019-07-23 SURGERY — PHACOEMULSIFICATION, CATARACT, WITH IOL INSERTION
Anesthesia: Monitor Anesthesia Care | Site: Eye | Laterality: Right

## 2019-07-23 MED ORDER — LIDOCAINE HCL (PF) 2 % IJ SOLN
INTRAOCULAR | Status: DC | PRN
  Administered 2019-07-23: 1 mL via INTRAOCULAR

## 2019-07-23 MED ORDER — ACETAMINOPHEN 160 MG/5ML PO SOLN: 325.0000 mg | ORAL | Status: DC | PRN

## 2019-07-23 MED ORDER — TETRACAINE HCL 0.5 % OP SOLN
1.0000 [drp] | OPHTHALMIC | Status: DC | PRN
  Administered 2019-07-23 (×3): 1 [drp] via OPHTHALMIC

## 2019-07-23 MED ORDER — MOXIFLOXACIN HCL 0.5 % OP SOLN
OPHTHALMIC | Status: DC | PRN
  Administered 2019-07-23: 0.2 mL via OPHTHALMIC

## 2019-07-23 MED ORDER — SODIUM HYALURONATE 10 MG/ML IO SOLN
INTRAOCULAR | Status: DC | PRN
  Administered 2019-07-23: 0.55 mL via INTRAOCULAR

## 2019-07-23 MED ORDER — MIDAZOLAM HCL 2 MG/2ML IJ SOLN
INTRAMUSCULAR | Status: DC | PRN
  Administered 2019-07-23: 1 mg via INTRAVENOUS

## 2019-07-23 MED ORDER — SODIUM HYALURONATE 23 MG/ML IO SOLN
INTRAOCULAR | Status: DC | PRN
  Administered 2019-07-23: 0.6 mL via INTRAOCULAR

## 2019-07-23 MED ORDER — EPINEPHRINE PF 1 MG/ML IJ SOLN
INTRAOCULAR | Status: DC | PRN
  Administered 2019-07-23: 64 mL via OPHTHALMIC

## 2019-07-23 MED ORDER — ARMC OPHTHALMIC DILATING DROPS
1.0000 "application " | OPHTHALMIC | Status: DC | PRN
  Administered 2019-07-23 (×3): 1 via OPHTHALMIC

## 2019-07-23 MED ORDER — ACETAMINOPHEN 325 MG PO TABS: 325.0000 mg | ORAL_TABLET | ORAL | Status: DC | PRN

## 2019-07-23 MED ORDER — FENTANYL CITRATE (PF) 100 MCG/2ML IJ SOLN
INTRAMUSCULAR | Status: DC | PRN
  Administered 2019-07-23: 50 ug via INTRAVENOUS

## 2019-07-23 MED ORDER — ONDANSETRON HCL 4 MG/2ML IJ SOLN: 4.0000 mg | Freq: Once | INTRAMUSCULAR | Status: DC | PRN

## 2019-07-23 SURGICAL SUPPLY — 20 items
CANNULA ANT/CHMB 27G (MISCELLANEOUS) ×2 IMPLANT
CANNULA ANT/CHMB 27GA (MISCELLANEOUS) ×6 IMPLANT
DISSECTOR HYDRO NUCLEUS 50X22 (MISCELLANEOUS) ×3 IMPLANT
GLOVE SURG LX 7.5 STRW (GLOVE) ×2
GLOVE SURG LX STRL 7.5 STRW (GLOVE) ×1 IMPLANT
GLOVE SURG SYN 8.5  E (GLOVE) ×3
GLOVE SURG SYN 8.5 E (GLOVE) ×1 IMPLANT
GLOVE SURG SYN 8.5 PF PI (GLOVE) ×1 IMPLANT
GOWN STRL REUS W/ TWL LRG LVL3 (GOWN DISPOSABLE) ×2 IMPLANT
GOWN STRL REUS W/TWL LRG LVL3 (GOWN DISPOSABLE) ×4
LENS IOL DIOP 22.5 (Intraocular Lens) ×3 IMPLANT
LENS IOL TECNIS MONO 22.5 (Intraocular Lens) IMPLANT
MARKER SKIN DUAL TIP RULER LAB (MISCELLANEOUS) ×3 IMPLANT
PACK DR. KING ARMS (PACKS) ×3 IMPLANT
PACK EYE AFTER SURG (MISCELLANEOUS) ×3 IMPLANT
PACK OPTHALMIC (MISCELLANEOUS) ×3 IMPLANT
SYR 3ML LL SCALE MARK (SYRINGE) ×3 IMPLANT
SYR TB 1ML LUER SLIP (SYRINGE) ×3 IMPLANT
WATER STERILE IRR 250ML POUR (IV SOLUTION) ×3 IMPLANT
WIPE NON LINTING 3.25X3.25 (MISCELLANEOUS) ×3 IMPLANT

## 2019-07-23 NOTE — Anesthesia Postprocedure Evaluation (Signed)
Anesthesia Post Note  Patient: Michelle Middleton  Procedure(s) Performed: CATARACT EXTRACTION PHACO AND INTRAOCULAR LENS PLACEMENT (IOC) RIGHT 1.98  00:22.6 (Right Eye)     Patient location during evaluation: PACU Anesthesia Type: MAC Level of consciousness: awake Pain management: pain level controlled Vital Signs Assessment: post-procedure vital signs reviewed and stable Respiratory status: respiratory function stable Cardiovascular status: stable Postop Assessment: no apparent nausea or vomiting Anesthetic complications: no   No complications documented.  Veda Canning

## 2019-07-23 NOTE — Anesthesia Preprocedure Evaluation (Signed)
Anesthesia Evaluation  Patient identified by MRN, date of birth, ID band Patient awake    Reviewed: Allergy & Precautions, NPO status   History of Anesthesia Complications Negative for: history of anesthetic complications  Airway Mallampati: II  TM Distance: >3 FB Neck ROM: Full    Dental no notable dental hx.    Pulmonary neg pulmonary ROS,    Pulmonary exam normal        Cardiovascular hypertension, Normal cardiovascular exam     Neuro/Psych Anxiety    GI/Hepatic GERD  Medicated and Controlled,  Endo/Other  Hypothyroidism Morbid obesity (BMI 40)  Renal/GU      Musculoskeletal  (+) Arthritis ,   Abdominal   Peds  Hematology   Anesthesia Other Findings   Reproductive/Obstetrics                             Anesthesia Physical  Anesthesia Plan  ASA: III  Anesthesia Plan: MAC   Post-op Pain Management:    Induction: Intravenous  PONV Risk Score and Plan: 2 and Midazolam and Treatment may vary due to age or medical condition  Airway Management Planned: Nasal Cannula and Natural Airway  Additional Equipment: None  Intra-op Plan:   Post-operative Plan:   Informed Consent: I have reviewed the patients History and Physical, chart, labs and discussed the procedure including the risks, benefits and alternatives for the proposed anesthesia with the patient or authorized representative who has indicated his/her understanding and acceptance.       Plan Discussed with: CRNA  Anesthesia Plan Comments:         Anesthesia Quick Evaluation

## 2019-07-23 NOTE — Op Note (Signed)
OPERATIVE NOTE  Michelle DOBROWOLSKI [id number] 07/23/2019   PREOPERATIVE DIAGNOSIS:  Nuclear sclerotic cataract right eye.  H25.11   POSTOPERATIVE DIAGNOSIS:    Nuclear sclerotic cataract right eye.     PROCEDURE:  Phacoemusification with posterior chamber intraocular lens placement of the right eye   LENS:   Implant Name Type Inv. Item Serial No. Manufacturer Lot No. LRB No. Used Action  LENS IOL DIOP 22.5 - [id number] Intraocular Lens LENS IOL DIOP 22.5 [id number] AMO  Right 1 Implanted       Procedure(s): CATARACT EXTRACTION PHACO AND INTRAOCULAR LENS PLACEMENT (IOC) RIGHT 1.98  00:22.6 (Right)  DCB00 +22.5   ULTRASOUND TIME: 0 minutes 22 seconds.  CDE 1.98   SURGEON:  Benay Pillow, MD, MPH  ANESTHESIOLOGIST: Anesthesiologist: Veda Canning, MD CRNA: Cameron Ali, CRNA   ANESTHESIA:  Topical with tetracaine drops augmented with 1% preservative-free intracameral lidocaine.  ESTIMATED BLOOD LOSS: less than 1 mL.   COMPLICATIONS:  None.   DESCRIPTION OF PROCEDURE:  The patient was identified in the holding room and transported to the operating room and placed in the supine position under the operating microscope.  The right eye was identified as the operative eye and it was prepped and draped in the usual sterile ophthalmic fashion.   A 1.0 millimeter clear-corneal paracentesis was made at the 10:30 position. 0.5 ml of preservative-free 1% lidocaine with epinephrine was injected into the anterior chamber.  The anterior chamber was filled with Healon 5 viscoelastic.  A 2.4 millimeter keratome was used to make a near-clear corneal incision at the 8:00 position.  A curvilinear capsulorrhexis was made with a cystotome and capsulorrhexis forceps.  Balanced salt solution was used to hydrodissect and hydrodelineate the nucleus.   Phacoemulsification was then used in stop and chop fashion to remove the lens nucleus and epinucleus.  The remaining cortex was then removed using the  irrigation and aspiration handpiece. Healon was then placed into the capsular bag to distend it for lens placement.  A lens was then injected into the capsular bag.  The remaining viscoelastic was aspirated.   Wounds were hydrated with balanced salt solution.  The anterior chamber was inflated to a physiologic pressure with balanced salt solution.   Intracameral vigamox 0.1 mL undiluted was injected into the eye and a drop placed onto the ocular surface.  No wound leaks were noted.  The patient was taken to the recovery room in stable condition without complications of anesthesia or surgery  Benay Pillow 07/23/2019, 8:20 AM

## 2019-07-23 NOTE — Anesthesia Procedure Notes (Signed)
Procedure Name: MAC Date/Time: 07/23/2019 8:01 AM Performed by: Cameron Ali, CRNA Pre-anesthesia Checklist: Patient identified, Emergency Drugs available, Suction available, Timeout performed and Patient being monitored Patient Re-evaluated:Patient Re-evaluated prior to induction Oxygen Delivery Method: Nasal cannula Placement Confirmation: positive ETCO2

## 2019-07-23 NOTE — H&P (Signed)

## 2019-07-23 NOTE — Transfer of Care (Signed)
Immediate Anesthesia Transfer of Care Note  Patient: Michelle Middleton  Procedure(s) Performed: CATARACT EXTRACTION PHACO AND INTRAOCULAR LENS PLACEMENT ([facility]) RIGHT 1.98  00:22.6 (Right Eye)  Patient Location: PACU  Anesthesia Type: MAC  Level of Consciousness: awake, alert  and patient cooperative  Airway and Oxygen Therapy: Patient Spontanous Breathing and Patient connected to supplemental oxygen  Post-op Assessment: Post-op Vital signs reviewed, Patient's Cardiovascular Status Stable, Respiratory Function Stable, Patent Airway and No signs of Nausea or vomiting  Post-op Vital Signs: Reviewed and stable  Complications: No complications documented.

## 2019-09-25 ENCOUNTER — Other Ambulatory Visit: Payer: Self-pay | Admitting: Obstetrics & Gynecology

## 2019-10-29 ENCOUNTER — Other Ambulatory Visit: Payer: Self-pay | Admitting: Internal Medicine

## 2019-10-29 DIAGNOSIS — Z1231 Encounter for screening mammogram for malignant neoplasm of breast: Secondary | ICD-10-CM

## 2019-11-07 ENCOUNTER — Ambulatory Visit: Payer: Medicare Other

## 2019-11-14 ENCOUNTER — Other Ambulatory Visit: Payer: Self-pay

## 2019-11-14 ENCOUNTER — Encounter (INDEPENDENT_AMBULATORY_CARE_PROVIDER_SITE_OTHER): Payer: Self-pay

## 2019-11-14 ENCOUNTER — Ambulatory Visit
Admission: RE | Admit: 2019-11-14 | Discharge: 2019-11-14 | Disposition: A | Discharge status: Home / Self Care | Payer: Medicare Other | Source: Ambulatory Visit | Attending: Internal Medicine | Admitting: Internal Medicine

## 2019-11-14 DIAGNOSIS — Z1231 Encounter for screening mammogram for malignant neoplasm of breast: Secondary | ICD-10-CM | POA: Insufficient documentation

## 2020-11-28 ENCOUNTER — Other Ambulatory Visit: Payer: Self-pay | Admitting: Internal Medicine

## 2020-11-28 DIAGNOSIS — Z1231 Encounter for screening mammogram for malignant neoplasm of breast: Secondary | ICD-10-CM

## 2020-12-22 ENCOUNTER — Inpatient Hospital Stay: Admission: RE | Admit: 2020-12-22 | Payer: Medicare Other | Source: Ambulatory Visit

## 2021-06-29 ENCOUNTER — Ambulatory Visit
Admission: RE | Admit: 2021-06-29 | Discharge: 2021-06-29 | Disposition: A | Discharge status: Home / Self Care | Payer: Medicare Other | Source: Ambulatory Visit | Attending: Family Medicine | Admitting: Family Medicine

## 2021-06-29 ENCOUNTER — Other Ambulatory Visit: Payer: Self-pay | Admitting: Family Medicine

## 2021-06-29 DIAGNOSIS — R600 Localized edema: Secondary | ICD-10-CM | POA: Diagnosis present

## 2021-06-29 DIAGNOSIS — M79671 Pain in right foot: Secondary | ICD-10-CM | POA: Diagnosis present

## 2022-03-06 ENCOUNTER — Ambulatory Visit
Admission: EM | Admit: 2022-03-06 | Discharge: 2022-03-06 | Disposition: A | Discharge status: Home / Self Care | Payer: Medicare Other

## 2022-03-06 ENCOUNTER — Encounter: Payer: Self-pay | Admitting: Emergency Medicine

## 2022-03-06 DIAGNOSIS — I83899 Varicose veins of unspecified lower extremities with other complications: Secondary | ICD-10-CM | POA: Diagnosis not present

## 2022-03-06 NOTE — Discharge Instructions (Addendum)
As we discussed, I believe you have ruptured one of your superficial spider veins or varicosities in your lower leg and that is where the redness is coming from.  Avoid scratching the area is much as possible to prevent damage to the skin and possible development of infection.  You may continue to hydrate the skin with topical lotions or emollients.  Take over-the-counter Claritin or Zyrtec to help with itching.  If you have any increasing redness, the area becomes hot, the area becomes tender, or you start running fever she needs to return for reevaluation.

## 2022-03-06 NOTE — ED Provider Notes (Signed)
[facility]    CSN: [id number] Arrival date & time: 03/06/22  1310      History   Chief Complaint Chief Complaint  Patient presents with   Rash    Left lower    HPI Michelle Middleton is a 82 y.o. female.   HPI  82 year old female here for evaluation of skin complaint.  Patient reports that she has had an area of itchy flaky skin on the outside of her left lower leg for approximately a week.  She is been applying Benadryl and calamine lotion without any significant improvement.  She states she has been scratching the area a lot.  This morning she woke up and the area was red and she came in because she was concerned she may be developing a blood clot.  She does not have any pain or swelling.  Past Medical History:  Diagnosis Date   Arthritis    Cancer ([place])    skin cancer   History of hiatal hernia    Hyperlipidemia    Hypertension    Hypothyroidism    Neuromuscular disorder ([place])    restless leg   Panic disorder    Vertigo    none recently    There are no problems to display for this patient.   Past Surgical History:  Procedure Laterality Date   ABDOMINAL HYSTERECTOMY     adenomatous polyp removed     BREAST BIOPSY Left 2011   benign bx/clip   CATARACT EXTRACTION W/PHACO Left 05/28/2019   Procedure: CATARACT EXTRACTION PHACO AND INTRAOCULAR LENS PLACEMENT ([place]) LEFT;  Surgeon: Eulogio Bear, MD;  Location: [place];  Service: Ophthalmology;  Laterality: Left;  2.89 0:25.9   CATARACT EXTRACTION W/PHACO Right 07/23/2019   Procedure: CATARACT EXTRACTION PHACO AND INTRAOCULAR LENS PLACEMENT (I[facility]) RIGHT 1.98  00:22.6;  Surgeon: Eulogio Bear, MD;  Location: [place];  Service: Ophthalmology;  Laterality: Right;   COLONOSCOPY     COLONOSCOPY WITH PROPOFOL N/A 03/02/2017   Procedure: COLONOSCOPY WITH PROPOFOL;  Surgeon: Toledo, Benay Pike, MD;  Location: [facility];  Service: Gastroenterology;  Laterality: N/A;   JOINT  REPLACEMENT     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      Home Medications    Prior to Admission medications   Medication Sig Start Date End Date Taking? Authorizing Provider  amLODipine (NORVASC) 5 MG tablet Take 5 mg by mouth daily.   Yes [provider]  hydrochlorothiazide (HYDRODIURIL) 25 MG tablet Take 25 mg by mouth daily as needed.   Yes [provider]  levothyroxine (SYNTHROID, LEVOTHROID) 112 MCG tablet Take 112 mcg by mouth daily before breakfast.   Yes [provider]  pantoprazole (PROTONIX) 40 MG tablet Take 40 mg by mouth daily.   Yes [provider]  sertraline (ZOLOFT) 50 MG tablet Take 50 mg by mouth daily.   Yes [provider]  spironolactone (ALDACTONE) 25 MG tablet Take 25 mg by mouth daily.   Yes [provider]  telmisartan (MICARDIS) 80 MG tablet Take 80 mg by mouth daily.   Yes [provider]  ALPRAZolam Duanne Moron) 0.5 MG tablet Take 0.5 mg by mouth 2 (two) times daily as needed for anxiety.    [provider]  cetirizine (ZYRTEC) 10 MG tablet Take 10 mg by mouth daily.    [provider]  cyclobenzaprine (FLEXERIL) 5 MG tablet Take  5 mg by mouth 3 (three) times daily as needed for muscle spasms.    [provider]  docusate sodium (COLACE) 100 MG capsule Take 100 mg by mouth daily.    [provider]  fluticasone (FLONASE) 50 MCG/ACT nasal spray Place 2 sprays into both nostrils daily.    [provider]  meloxicam (MOBIC) 7.5 MG tablet Take 7.5 mg by mouth daily.    [provider]    Family History Family History  Problem Relation Age of Onset   Breast cancer Maternal Aunt        age unknown   Breast cancer Sister 59    Social History Social History   Tobacco Use   Smoking status: Never   Smokeless tobacco: Never  Vaping Use   Vaping Use: Never used  Substance Use Topics    Alcohol use: No   Drug use: No     Allergies   Allegra-d [fexofenadine-pseudoephed er], Aspirin, Coumadin [warfarin], Heparin, Lovenox [enoxaparin], and Paxil [paroxetine hcl]   Review of Systems Review of Systems  Constitutional:  Negative for fever.  Skin:  Positive for color change. Negative for wound.     Physical Exam Triage Vital Signs ED Triage Vitals  Enc Vitals Group     BP 03/06/22 1437 119/83     Pulse Rate 03/06/22 1437 87     Resp 03/06/22 1437 14     Temp 03/06/22 1437 98 F (36.7 C)     Temp Source 03/06/22 1437 Oral     SpO2 03/06/22 1437 97 %     Weight 03/06/22 1434 195 lb (88.5 kg)     Height 03/06/22 1434 '5\' 2"'$  (1.575 m)     Head Circumference --      Peak Flow --      Pain Score 03/06/22 1434 0     Pain Loc --      Pain Edu? --      Excl. in [place]? --    No data found.  Updated Vital Signs BP 119/83 (BP Location: Left Arm)   Pulse 87   Temp 98 F (36.7 C) (Oral)   Resp 14   Ht '5\' 2"'$  (1.575 m)   Wt 195 lb (88.5 kg)   SpO2 97%   BMI 35.67 kg/m   Visual Acuity Right Eye Distance:   Left Eye Distance:   Bilateral Distance:    Right Eye Near:   Left Eye Near:    Bilateral Near:     Physical Exam Vitals and nursing note reviewed.  Constitutional:      Appearance: Normal appearance. She is not ill-appearing.  HENT:     Head: Normocephalic and atraumatic.  Skin:    General: Skin is warm and dry.     Capillary Refill: Capillary refill takes less than 2 seconds.     Findings: Erythema present. No rash.  Neurological:     General: No focal deficit present.     Mental Status: She is alert and oriented to person, place, and time.  Psychiatric:        Mood and Affect: Mood normal.        Behavior: Behavior normal.        Thought Content: Thought content normal.        Judgment: Judgment normal.      UC Treatments / Results  Labs (all labs ordered are listed, but only abnormal results are displayed) Labs Reviewed - No data to  display  EKG  Radiology No results found.  Procedures Procedures (including critical care time)  Medications Ordered in UC Medications - No data to display  Initial Impression / Assessment and Plan / UC Course  I have reviewed the triage vital signs and the nursing notes.  Pertinent labs & imaging results that were available during my care of the patient were reviewed by me and considered in my medical decision making (see chart for details).   Patient is a pleasant, nontoxic-appearing 34 old female here for evaluation of a red area on the outside of her left lower leg that she noticed this morning.  The area above is the area in question and there is a silver dollar area of nonblanchable erythema on the surface of the skin.  There are vertical striation lines that look like fingernail marks from scratching.  The area is not hot and it is not tender to touch.  There is no calf tenderness and patient is a negative Bevelyn Buckles' sign in her left lower leg.  DP and PT pulses are 2+.  I suspect that the patient has ruptured superficial capillaries or possibly a small superficial varicosity in her left lower leg.  I do not feel that she has a blood clot in her leg as there is no cording or tenderness.  Also no calf swelling.  Final Clinical Impressions(s) / UC Diagnoses   Final diagnoses:  Ruptured varicosity     Discharge Instructions      As we discussed, I believe you have ruptured one of your superficial spider veins or varicosities in your lower leg and that is where the redness is coming from.  Avoid scratching the area is much as possible to prevent damage to the skin and possible development of infection.  You may continue to hydrate the skin with topical lotions or emollients.  Take over-the-counter Claritin or Zyrtec to help with itching.  If you have any increasing redness, the area becomes hot, the area becomes tender, or you start running fever she needs to return for  reevaluation.     ED Prescriptions   None    PDMP not reviewed this encounter.   Margarette Canada, NP 03/06/22 1500

## 2022-03-06 NOTE — ED Triage Notes (Signed)
Patient reports itchy skin on her left lower leg for a week.  Patient states that she has been putting Benadryl cream and Calamine lotion on it.  Patient states that when she woke up this morning she had redden area on her left lower leg.  Patient denies any pain or swelling.

## 2023-01-28 ENCOUNTER — Other Ambulatory Visit: Payer: Self-pay | Admitting: Orthopedic Surgery

## 2023-01-28 DIAGNOSIS — M5136 Other intervertebral disc degeneration, lumbar region with discogenic back pain only: Secondary | ICD-10-CM

## 2023-01-28 DIAGNOSIS — M4807 Spinal stenosis, lumbosacral region: Secondary | ICD-10-CM

## 2023-01-28 DIAGNOSIS — M4802 Spinal stenosis, cervical region: Secondary | ICD-10-CM

## 2023-02-19 ENCOUNTER — Other Ambulatory Visit: Payer: Medicare Other

## 2023-02-26 ENCOUNTER — Ambulatory Visit
Admission: RE | Admit: 2023-02-26 | Discharge: 2023-02-26 | Disposition: A | Discharge status: Home / Self Care | Payer: Medicare Other | Source: Ambulatory Visit | Attending: Orthopedic Surgery | Admitting: Orthopedic Surgery

## 2023-02-26 DIAGNOSIS — M4802 Spinal stenosis, cervical region: Secondary | ICD-10-CM

## 2023-02-26 DIAGNOSIS — M4807 Spinal stenosis, lumbosacral region: Secondary | ICD-10-CM

## 2023-02-26 DIAGNOSIS — M5136 Other intervertebral disc degeneration, lumbar region with discogenic back pain only: Secondary | ICD-10-CM
